# Patient Record
Sex: Male | Born: 1935 | Race: Black or African American | Hispanic: No | Marital: Married | Smoking: Former smoker
Health system: Southern US, Community
[De-identification: ages and names within clinical notes are randomized; demographics above are authoritative.]

## PROBLEM LIST (undated history)

## (undated) DIAGNOSIS — M1990 Unspecified osteoarthritis, unspecified site: Secondary | ICD-10-CM

## (undated) DIAGNOSIS — E119 Type 2 diabetes mellitus without complications: Secondary | ICD-10-CM

## (undated) DIAGNOSIS — E7800 Pure hypercholesterolemia, unspecified: Secondary | ICD-10-CM

## (undated) DIAGNOSIS — I10 Essential (primary) hypertension: Secondary | ICD-10-CM

---

## 2014-06-29 ENCOUNTER — Emergency Department (HOSPITAL_COMMUNITY): Payer: Medicare Other

## 2014-06-29 ENCOUNTER — Emergency Department (HOSPITAL_COMMUNITY)
Admission: EM | Admit: 2014-06-29 | Discharge: 2014-06-29 | Disposition: A | Discharge status: Home / Self Care | Payer: Medicare Other | Attending: Emergency Medicine | Admitting: Emergency Medicine

## 2014-06-29 ENCOUNTER — Encounter (HOSPITAL_COMMUNITY): Payer: Self-pay | Admitting: Emergency Medicine

## 2014-06-29 DIAGNOSIS — R61 Generalized hyperhidrosis: Secondary | ICD-10-CM | POA: Insufficient documentation

## 2014-06-29 DIAGNOSIS — I10 Essential (primary) hypertension: Secondary | ICD-10-CM | POA: Insufficient documentation

## 2014-06-29 DIAGNOSIS — E780 Pure hypercholesterolemia: Secondary | ICD-10-CM | POA: Insufficient documentation

## 2014-06-29 DIAGNOSIS — Y929 Unspecified place or not applicable: Secondary | ICD-10-CM | POA: Insufficient documentation

## 2014-06-29 DIAGNOSIS — W278XXA Contact with other nonpowered hand tool, initial encounter: Secondary | ICD-10-CM | POA: Diagnosis not present

## 2014-06-29 DIAGNOSIS — E119 Type 2 diabetes mellitus without complications: Secondary | ICD-10-CM | POA: Diagnosis not present

## 2014-06-29 DIAGNOSIS — Z793 Long term (current) use of hormonal contraceptives: Secondary | ICD-10-CM | POA: Diagnosis not present

## 2014-06-29 DIAGNOSIS — R55 Syncope and collapse: Secondary | ICD-10-CM | POA: Insufficient documentation

## 2014-06-29 DIAGNOSIS — Z87891 Personal history of nicotine dependence: Secondary | ICD-10-CM | POA: Insufficient documentation

## 2014-06-29 DIAGNOSIS — Z794 Long term (current) use of insulin: Secondary | ICD-10-CM | POA: Diagnosis not present

## 2014-06-29 DIAGNOSIS — S0081XA Abrasion of other part of head, initial encounter: Secondary | ICD-10-CM | POA: Insufficient documentation

## 2014-06-29 DIAGNOSIS — Y93E8 Activity, other personal hygiene: Secondary | ICD-10-CM | POA: Insufficient documentation

## 2014-06-29 DIAGNOSIS — Z79899 Other long term (current) drug therapy: Secondary | ICD-10-CM | POA: Insufficient documentation

## 2014-06-29 DIAGNOSIS — Z8739 Personal history of other diseases of the musculoskeletal system and connective tissue: Secondary | ICD-10-CM | POA: Diagnosis not present

## 2014-06-29 HISTORY — DX: Pure hypercholesterolemia, unspecified: E78.00

## 2014-06-29 HISTORY — DX: Type 2 diabetes mellitus without complications: E11.9

## 2014-06-29 HISTORY — DX: Unspecified osteoarthritis, unspecified site: M19.90

## 2014-06-29 HISTORY — DX: Essential (primary) hypertension: I10

## 2014-06-29 LAB — CBC WITH DIFFERENTIAL/PLATELET
Basophils Absolute: 0 10*3/uL (ref 0.0–0.1)
Basophils Relative: 0 % (ref 0–1)
Eosinophils Absolute: 0.1 10*3/uL (ref 0.0–0.7)
Eosinophils Relative: 1 % (ref 0–5)
HCT: 28 % — ABNORMAL LOW (ref 39.0–52.0)
HEMOGLOBIN: 9.5 g/dL — AB (ref 13.0–17.0)
LYMPHS ABS: 1.6 10*3/uL (ref 0.7–4.0)
LYMPHS PCT: 16 % (ref 12–46)
MCH: 30.6 pg (ref 26.0–34.0)
MCHC: 33.9 g/dL (ref 30.0–36.0)
MCV: 90.3 fL (ref 78.0–100.0)
MONOS PCT: 11 % (ref 3–12)
Monocytes Absolute: 1.1 10*3/uL — ABNORMAL HIGH (ref 0.1–1.0)
NEUTROS PCT: 72 % (ref 43–77)
Neutro Abs: 7.4 10*3/uL (ref 1.7–7.7)
PLATELETS: 244 10*3/uL (ref 150–400)
RBC: 3.1 MIL/uL — AB (ref 4.22–5.81)
RDW: 17.1 % — ABNORMAL HIGH (ref 11.5–15.5)
WBC: 10.2 10*3/uL (ref 4.0–10.5)

## 2014-06-29 LAB — COMPREHENSIVE METABOLIC PANEL
ALK PHOS: 38 U/L — AB (ref 39–117)
ALT: 14 U/L (ref 0–53)
ANION GAP: 11 (ref 5–15)
AST: 23 U/L (ref 0–37)
Albumin: 3.1 g/dL — ABNORMAL LOW (ref 3.5–5.2)
BILIRUBIN TOTAL: 0.7 mg/dL (ref 0.3–1.2)
BUN: 25 mg/dL — AB (ref 6–23)
CHLORIDE: 102 meq/L (ref 96–112)
CO2: 20 meq/L (ref 19–32)
Calcium: 8.7 mg/dL (ref 8.4–10.5)
Creatinine, Ser: 1.55 mg/dL — ABNORMAL HIGH (ref 0.50–1.35)
GFR calc non Af Amer: 39 mL/min — ABNORMAL LOW (ref >90)
GFR, EST AFRICAN AMERICAN: 45 mL/min — AB (ref >90)
GLUCOSE: 115 mg/dL — AB (ref 70–99)
POTASSIUM: 4.5 meq/L (ref 3.7–5.3)
SODIUM: 133 meq/L — AB (ref 137–147)
Total Protein: 9.7 g/dL — ABNORMAL HIGH (ref 6.0–8.3)

## 2014-06-29 LAB — URINE MICROSCOPIC-ADD ON

## 2014-06-29 LAB — URINALYSIS, ROUTINE W REFLEX MICROSCOPIC
BILIRUBIN URINE: NEGATIVE
Glucose, UA: NEGATIVE mg/dL
HGB URINE DIPSTICK: NEGATIVE
Ketones, ur: NEGATIVE mg/dL
Leukocytes, UA: NEGATIVE
Nitrite: NEGATIVE
PROTEIN: 100 mg/dL — AB
Specific Gravity, Urine: 1.014 (ref 1.005–1.030)
Urobilinogen, UA: 1 mg/dL (ref 0.0–1.0)
pH: 6 (ref 5.0–8.0)

## 2014-06-29 LAB — TROPONIN I: Troponin I: <0.3 ng/mL (ref <0.30)

## 2014-06-29 MED ORDER — SODIUM CHLORIDE 0.9 % IV BOLUS (SEPSIS)
500.0000 mL | Freq: Once | INTRAVENOUS | Status: AC
  Administered 2014-06-29: 500 mL via INTRAVENOUS

## 2014-06-29 NOTE — Discharge Instructions (Signed)
Near-Syncope Near-syncope (commonly known as near fainting) is sudden weakness, dizziness, or feeling like you might pass out. During an episode of near-syncope, you may also develop pale skin, have tunnel vision, or feel sick to your stomach (nauseous). Near-syncope may occur when getting up after sitting or while standing for a long time. It is caused by a sudden decrease in blood flow to the brain. This decrease can result from various causes or triggers, most of which are not serious. However, because near-syncope can sometimes be a sign of something serious, a medical evaluation is required. The specific cause is often not determined. HOME CARE INSTRUCTIONS  Monitor your condition for any changes. The following actions may help to alleviate any discomfort you are experiencing:  Have someone stay with you until you feel stable.  Lie down right away and prop your feet up if you start feeling like you might faint. Breathe deeply and steadily. Wait until all the symptoms have passed. Most of these episodes last only a few minutes. You may feel tired for several hours.   Drink enough fluids to keep your urine clear or pale yellow.   If you are taking blood pressure or heart medicine, get up slowly when seated or lying down. Take several minutes to sit and then stand. This can reduce dizziness.  Follow up with your health care provider as directed. SEEK IMMEDIATE MEDICAL CARE IF:   You have a severe headache.   You have unusual pain in the chest, abdomen, or back.   You are bleeding from the mouth or rectum, or you have black or tarry stool.   You have an irregular or very fast heartbeat.   You have repeated fainting or have seizure-like jerking during an episode.   You faint when sitting or lying down.   You have confusion.   You have difficulty walking.   You have severe weakness.   You have vision problems.  MAKE SURE YOU:   Understand these instructions.  Will  watch your condition.  Will get help right away if you are not doing well or get worse. Document Released: 08/23/2005 Document Revised: 08/28/2013 Document Reviewed: 01/26/2013 [employer] Patient Information 2015 [employer]. This information is not intended to replace advice given to you by your health care provider. Make sure you discuss any questions you have with your health care provider.  

## 2014-06-29 NOTE — ED Provider Notes (Signed)
CSN: [id number]     Arrival date & time 06/29/14  1404 History   First MD Initiated Contact with Patient 06/29/14 1459     Chief Complaint  Patient presents with  . Near Syncope     (Consider location/radiation/quality/duration/timing/severity/associated sxs/prior Treatment) Patient is a 80 y.o. male presenting with near-syncope. The history is provided by the patient.  Near Syncope This is a new problem. The current episode started less than 1 hour ago. Episode frequency: once. The problem has been resolved. Pertinent negatives include no chest pain, no abdominal pain, no headaches and no shortness of breath. The symptoms are aggravated by walking and standing (heat). The symptoms are relieved by rest. He has tried nothing for the symptoms. The treatment provided no relief.    Past Medical History  Diagnosis Date  . Hypertension   . Diabetes mellitus without complication   . Arthritis   . Hypercholesterolemia    History reviewed. No pertinent past surgical history. No family history on file. History  Substance Use Topics  . Smoking status: Former Games developermoker  . Smokeless tobacco: Not on file  . Alcohol Use: No    Review of Systems  Constitutional: Negative for fever.  HENT: Negative for drooling and rhinorrhea.   Eyes: Negative for pain.  Respiratory: Negative for cough and shortness of breath.   Cardiovascular: Positive for near-syncope. Negative for chest pain and leg swelling.  Gastrointestinal: Negative for nausea, vomiting, abdominal pain and diarrhea.  Genitourinary: Negative for dysuria and hematuria.  Musculoskeletal: Negative for gait problem and neck pain.  Skin: Negative for color change.  Neurological: Negative for numbness and headaches.  Hematological: Negative for adenopathy.  Psychiatric/Behavioral: Negative for behavioral problems.  All other systems reviewed and are negative.     Allergies  Review of patient's allergies indicates no known  allergies.  Home Medications   Prior to Admission medications   Medication Sig Start Date End Date Taking? Authorizing Provider  Bioflavonoid Products (VITAMIN C) CHEW Chew 1 tablet by mouth daily.   Yes Historical Provider, MD  cholecalciferol (VITAMIN D) 1000 UNITS tablet Take 1,000 Units by mouth daily.   Yes Historical Provider, MD  citalopram (CELEXA) 10 MG tablet Take 1 tablet by mouth daily. 06/14/14  Yes Historical Provider, MD  CRESTOR 5 MG tablet Take 1 tablet by mouth daily. 06/19/14  Yes Historical Provider, MD  Cyanocobalamin (VITAMIN B-12 PO) Take 1 tablet by mouth daily.   Yes Historical Provider, MD  docusate sodium (COLACE) 100 MG capsule Take 100 mg by mouth daily.   Yes Historical Provider, MD  donepezil (ARICEPT) 10 MG tablet Take 1 tablet by mouth 2 (two) times daily. 06/14/14  Yes Historical Provider, MD  ferrous sulfate 325 (65 FE) MG tablet Take 325 mg by mouth 2 (two) times daily with a meal.   Yes Historical Provider, MD  GAMMAGARD 30 GM/300ML SOLN Inject into the vein. Five days a week, before the end of the month 06/19/14  Yes Historical Provider, MD  insulin glargine (LANTUS) 100 UNIT/ML injection Inject 11 Units into the skin at bedtime.   Yes Historical Provider, MD  megestrol (MEGACE) 40 MG/ML suspension Take 800 mg by mouth daily.  05/23/14  Yes Historical Provider, MD   BP 133/60  Pulse 81  Temp(Src) 98.5 F (36.9 C)  Resp 18  SpO2 95% Physical Exam  Nursing note and vitals reviewed. Constitutional: He is oriented to person, place, and time. He appears well-developed and well-nourished.  HENT:  Head: Normocephalic  and atraumatic.  Right Ear: External ear normal.  Left Ear: External ear normal.  Nose: Nose normal.  Mouth/Throat: Oropharynx is clear and moist. No oropharyngeal exudate.  Mild abrasion to chin. (from shaving per pt)  Eyes: Conjunctivae and EOM are normal. Pupils are equal, round, and reactive to light.  Neck: Normal range of motion. Neck  supple.  Cardiovascular: Normal rate, regular rhythm, normal heart sounds and intact distal pulses.  Exam reveals no gallop and no friction rub.   No murmur heard. Pulmonary/Chest: Effort normal and breath sounds normal. No respiratory distress. He has no wheezes.  Abdominal: Soft. Bowel sounds are normal. He exhibits no distension. There is no tenderness. There is no rebound and no guarding.  Musculoskeletal: Normal range of motion. He exhibits no edema and no tenderness.  Neurological: He is alert and oriented to person, place, and time.  alert, oriented x3 speech: normal in context and clarity memory: intact grossly cranial nerves II-XII: intact motor strength: full proximally and distally no involuntary movements or tremors sensation: intact to light touch diffusely  cerebellar: finger-to-nose and heel-to-shin intact gait: normal forwards and backwards   Skin: Skin is warm and dry.  Psychiatric: He has a normal mood and affect. His behavior is normal.    ED Course  Procedures (including critical care time) Labs Review Labs Reviewed  CBC WITH DIFFERENTIAL - Abnormal; Notable for the following:    RBC 3.10 (*)    Hemoglobin 9.5 (*)    HCT 28.0 (*)    RDW 17.1 (*)    Monocytes Absolute 1.1 (*)    All other components within normal limits  COMPREHENSIVE METABOLIC PANEL  TROPONIN I  URINALYSIS, ROUTINE W REFLEX MICROSCOPIC    Imaging Review No results found.   EKG Interpretation   Date/Time:  Saturday June 29 2014 14:10:55 EDT Ventricular Rate:  81 PR Interval:  168 QRS Duration: 86 QT Interval:  376 QTC Calculation: 436 R Axis:   -20 Text Interpretation:  Sinus rhythm Ventricular premature complex Inferior  infarct, old Baseline wander in lead(s) I III aVL V3 No previous tracing  Confirmed by KNAPP  MD-J, JON ([id number]) on 06/29/2014 2:44:39 PM      MDM   Final diagnosis: Near syncope  3:30 PM 80 y.o. male w HTN, DM on gammagard who presents with a near  syncopal episode. He states that he had walked several blocks going to the football game today. He states that he was diaphoretic and began feeling lightheaded while standing against a fence. He had a near syncopal event. He was caught by several people and did not hit the ground. He did not fully lose consciousness. He denies any chest pain or shortness of breath. He is currently asymptomatic. He has a good story for neurocardiogenic syncope. We'll get screening lab work and imaging. BS was 160's per family when it was taken right after the event.   5:23 PM: Cr mildly elev, likely chronic. He did get 500 cc IVF here. Otherwise labs and imaging noncontributory. The patient continues to appear well and remains asymptomatic. I have discussed the diagnosis/risks/treatment options with the patient and family and believe the pt to be eligible for discharge home to follow-up with his pcp next week. We also discussed returning to the ED immediately if new or worsening sx occur. We discussed the sx which are most concerning (e.g., recurrent episodes of near syncope/syncope, fever, sob, cp) that necessitate immediate return. Medications administered to the patient during their visit and  any new prescriptions provided to the patient are listed below.  Medications given during this visit Medications  sodium chloride 0.9 % bolus 500 mL (0 mLs Intravenous Stopped 06/29/14 1714)    New Prescriptions   No medications on file     Purvis Sheffield, MD 06/29/14 1726

## 2014-06-29 NOTE — ED Notes (Signed)
EMS - Pt had a near syncopal episode.  Pt was diaphoretic and pale on scene, no N/V/D, no chest pain.  Pt was lowered to the ground by family, pt is dressed in layers and states he may have gotten worked up by loosing sight of his wife and got hot.

## 2015-03-01 IMAGING — CR DG CHEST 2V
2 series · 2 of 2 positions shown · non-contrast
Comparison: None.

CLINICAL DATA: Shortness of breath

EXAM:
CHEST  2 VIEW

[w chest pa]
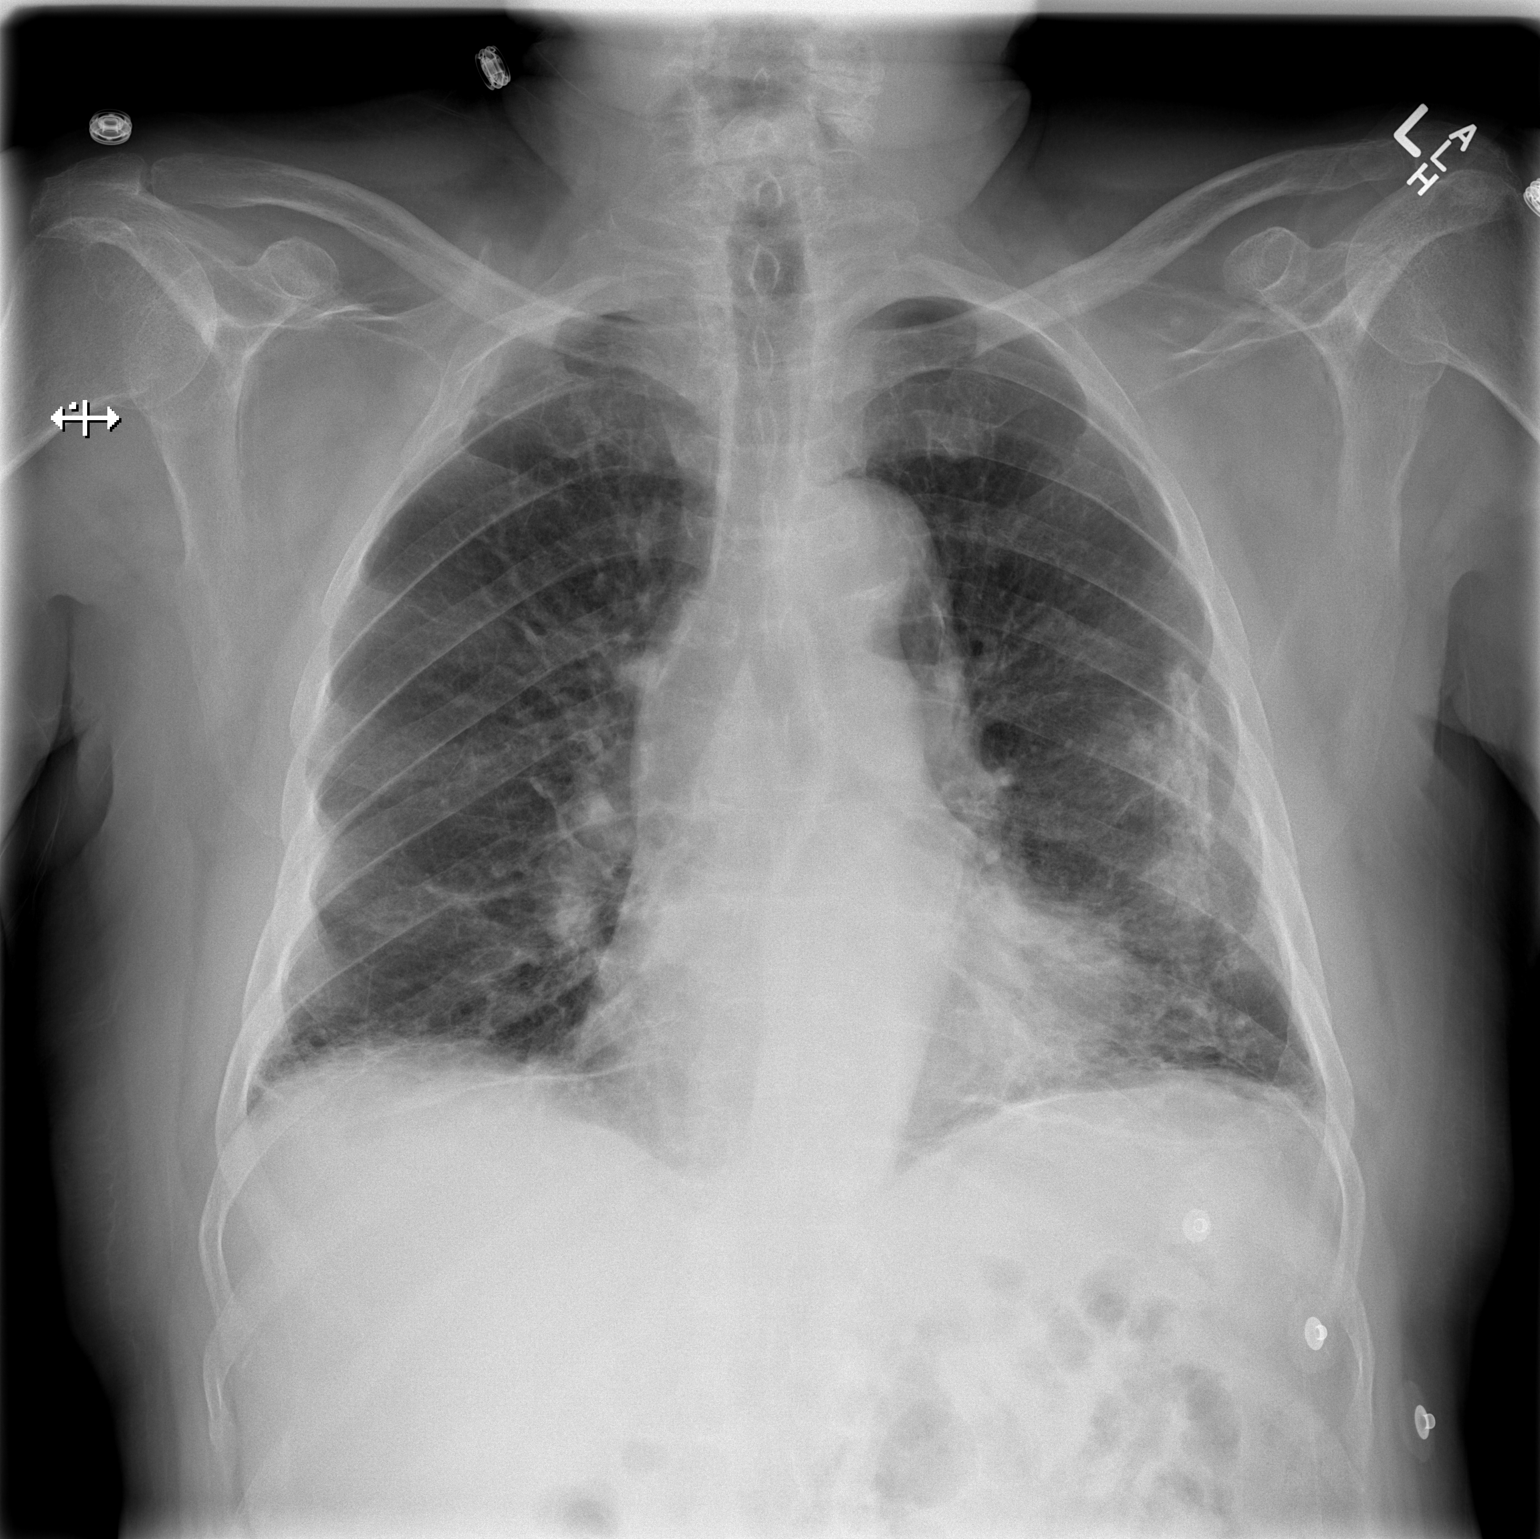

[w chest lat]
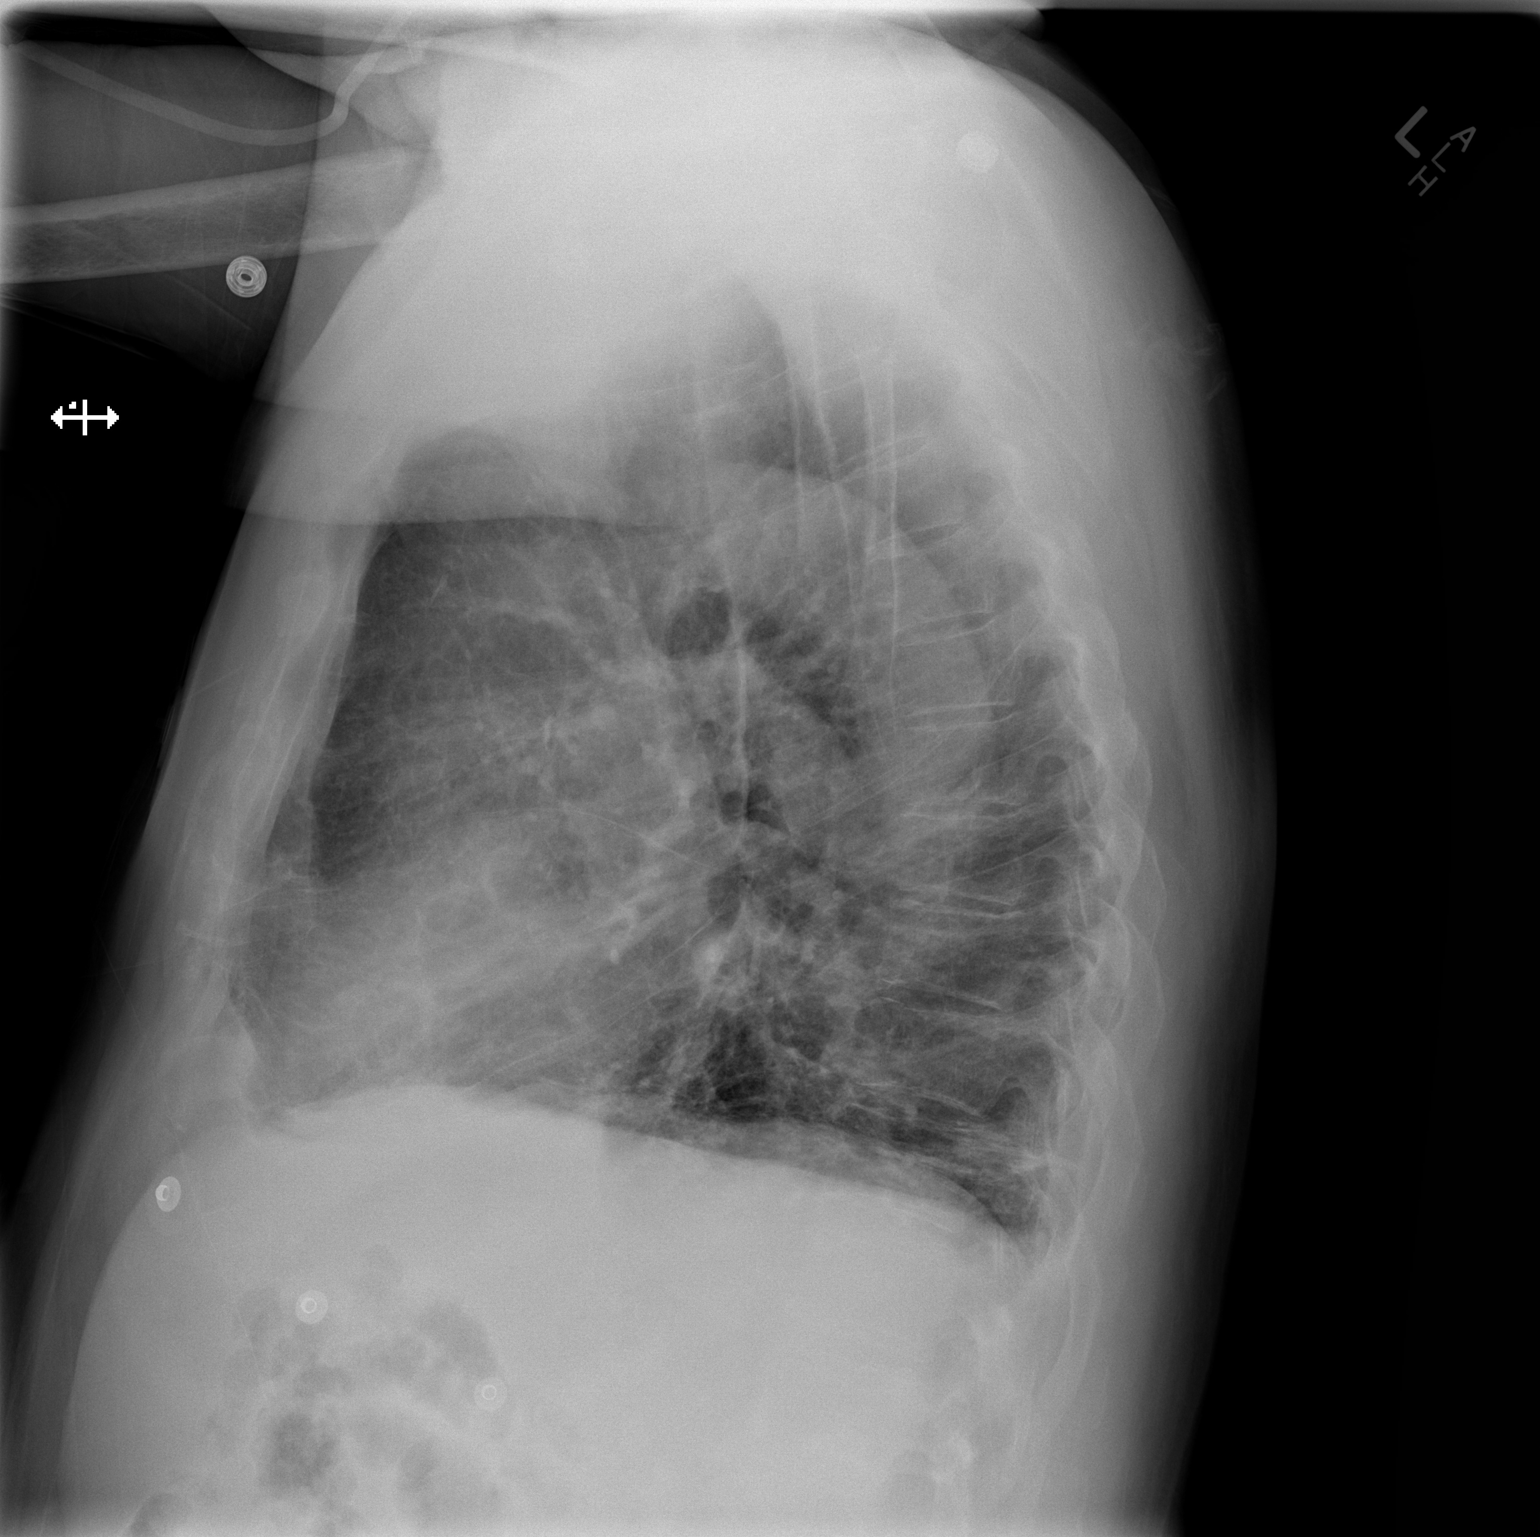

[2 of 2 positions shown; findings below may reference images not displayed]

FINDINGS: Chronic interstitial markings/ emphysematous changes with bibasilar
scarring/ fibrosis and calcified pleural plaques in the left
hemithorax.

No focal consolidation.  No pleural effusion or pneumothorax.

The heart is normal in size.

Degenerative changes of the visualized thoracolumbar spine.
IMPRESSION: Chronic bilateral lower lobe scarring/ fibrosis with calcified
pleural plaques.

No evidence of acute cardiopulmonary disease.

## 2021-11-04 DEATH — deceased
